# Patient Record
Sex: Female | Born: 1990 | Race: Black or African American | Hispanic: No | Marital: Single | Smoking: Never smoker
Health system: Southern US, Community
[De-identification: ages and names within clinical notes are randomized; demographics above are authoritative.]

## PROBLEM LIST (undated history)

## (undated) ENCOUNTER — Inpatient Hospital Stay (HOSPITAL_COMMUNITY): Payer: Self-pay

## (undated) DIAGNOSIS — Z789 Other specified health status: Secondary | ICD-10-CM

## (undated) HISTORY — PX: WISDOM TOOTH EXTRACTION: SHX21

---

## 2013-01-13 LAB — CYTOLOGY - PAP: PAP SMEAR: NEGATIVE

## 2013-12-01 ENCOUNTER — Ambulatory Visit (INDEPENDENT_AMBULATORY_CARE_PROVIDER_SITE_OTHER): Payer: BC Managed Care – PPO | Admitting: Family Medicine

## 2013-12-01 VITALS — BP 112/72 | HR 90 | Temp 99.3°F | Resp 18 | Ht 61.5 in | Wt 146.0 lb

## 2013-12-01 DIAGNOSIS — J209 Acute bronchitis, unspecified: Secondary | ICD-10-CM

## 2013-12-01 DIAGNOSIS — R059 Cough, unspecified: Secondary | ICD-10-CM

## 2013-12-01 DIAGNOSIS — R05 Cough: Secondary | ICD-10-CM

## 2013-12-01 DIAGNOSIS — J329 Chronic sinusitis, unspecified: Secondary | ICD-10-CM

## 2013-12-01 MED ORDER — HYDROCODONE-HOMATROPINE 5-1.5 MG/5ML PO SYRP: 5.0000 mL | ORAL_SOLUTION | ORAL | Status: DC | PRN

## 2013-12-01 MED ORDER — AMOXICILLIN 875 MG PO TABS: 875.0000 mg | ORAL_TABLET | Freq: Two times a day (BID) | ORAL | Status: DC

## 2013-12-01 MED ORDER — BENZONATATE 100 MG PO CAPS: 100.0000 mg | ORAL_CAPSULE | Freq: Three times a day (TID) | ORAL | Status: DC | PRN

## 2013-12-01 NOTE — Patient Instructions (Signed)
Drink plenty of fluids and try to get enough rest  Take the antibiotic, amoxicillin, one twice daily  Take the cough pills one or 2 pills 3 times daily if needed. If unable to get them just use lozenges  Take the cough syrup 1 teaspoon every 4 hours in the evening or nighttime. Will make you drowsy usually.  Return if worse

## 2013-12-01 NOTE — Progress Notes (Signed)
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. 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 She also works as a Engineer, productionserver and cashier. She did not get a flu shot this year. On about the 30th she got sick with congestion and cough. She did not have much of a fever, but she was taking cough and cold medications with acetaminophen in them. She does not smoke. She missed work all last week. She has persisted with a cough, which actually has gotten worse the last day or 2. She has continued to produce mucus from her nose and chest.  Objective: Healthy-appearing young lady. Her TMs are both partially occluded by cerumen. Her throat was clear. Sinuses nontender. Neck supple without significant nodes. Chest is clear to auscultation. Heart regular without murmurs.  Assessment: Postop cough syndrome, worsening Sinus congestion  Plan: With this going on for 2 weeks and cough now getting worse I am going to go ahead and give her an antibiotic for the cough, and some cough medications. The antibiotic should also help the sinuses if there is a bacterial component there.  Return if not improving

## 2013-12-04 ENCOUNTER — Ambulatory Visit (INDEPENDENT_AMBULATORY_CARE_PROVIDER_SITE_OTHER): Payer: BC Managed Care – PPO | Admitting: Emergency Medicine

## 2013-12-04 VITALS — BP 100/60 | HR 95 | Temp 99.1°F | Resp 16 | Ht 60.5 in | Wt 143.0 lb

## 2013-12-04 DIAGNOSIS — J018 Other acute sinusitis: Secondary | ICD-10-CM

## 2013-12-04 DIAGNOSIS — J209 Acute bronchitis, unspecified: Secondary | ICD-10-CM

## 2013-12-04 MED ORDER — PSEUDOEPHEDRINE-GUAIFENESIN ER 60-600 MG PO TB12: 1.0000 | ORAL_TABLET | Freq: Two times a day (BID) | ORAL | Status: AC

## 2013-12-04 MED ORDER — HYDROCOD POLST-CHLORPHEN POLST 10-8 MG/5ML PO LQCR: 5.0000 mL | Freq: Two times a day (BID) | ORAL | Status: DC | PRN

## 2013-12-04 MED ORDER — AMOXICILLIN-POT CLAVULANATE ER 1000-62.5 MG PO TB12: 2.0000 | ORAL_TABLET | Freq: Two times a day (BID) | ORAL | Status: DC

## 2013-12-04 NOTE — Progress Notes (Signed)
[facility] [address], [place] [id number] [phone]  Date:  12/04/2013   Name:  Marilyn Patel   DOB:  1990/11/23   MRN:  [id number]  PCP:  No primary provider on file.    Chief Complaint: Follow-up   History of Present Illness:  Marilyn Patel is a 23 y.o. very pleasant female patient who presents with the following:  Ill with nasal congestion and clear drainage, purulent post nasal drainage and a sore throat.  Cough is productive of mucopurulent sputum.  No fever or chills.  No improvement on medications from last visit.  Not able to obtain backordered cough syrup.  No improvement with over the counter medications or other home remedies. Denies other complaint or health concern today.   There are no active problems to display for this patient.   History reviewed. No pertinent past medical history.  History reviewed. No pertinent past surgical history.  History  Substance Use Topics  . Smoking status: Never Smoker   . Smokeless tobacco: Not on file  . Alcohol Use: Yes    History reviewed. No pertinent family history.  No Known Allergies  Medication list has been reviewed and updated.  Current Outpatient Prescriptions on File Prior to Visit  Medication Sig Dispense Refill  . amoxicillin (AMOXIL) 875 MG tablet Take 1 tablet (875 mg total) by mouth 2 (two) times daily.  20 tablet  0  . benzonatate (TESSALON) 100 MG capsule Take 1-2 capsules (100-200 mg total) by mouth 3 (three) times daily as needed for cough.  30 capsule  0  . HYDROcodone-homatropine (HYCODAN) 5-1.5 MG/5ML syrup Take 5 mLs by mouth every 4 (four) hours as needed for cough.  120 mL  0   No current facility-administered medications on file prior to visit.    Review of Systems:  As per HPI, otherwise negative.    Physical Examination: Filed Vitals:   12/04/13 1000  BP: 100/60  Pulse: 95  Temp: 99.1 F (37.3 C)  Resp: 16   Filed Vitals:   12/04/13 1000   Height: 5' 0.5" (1.537 m)  Weight: 143 lb (64.864 kg)   Body mass index is 27.46 kg/(m^2). Ideal Body Weight: Weight in (lb) to have BMI = 25: 129.9  GEN: WDWN, moderate distress, Non-toxic, A & O x 3  Persistent cough HEENT: Atraumatic, Normocephalic. Neck supple. No masses, No LAD. Ears and Nose: No external deformity. CV: RRR, No M/G/R. No JVD. No thrill. No extra heart sounds. PULM: CTA B, no wheezes, crackles, rhonchi. No retractions. No resp. distress. No accessory muscle use. ABD: S, NT, ND, +BS. No rebound. No HSM. EXTR: No c/c/e NEURO Normal gait.  PSYCH: Normally interactive. Conversant. Not depressed or anxious appearing.  Calm demeanor.    Assessment and Plan: Bronchitis Sinusitis tussionex mucinex d augmentin xr  Signed,  Phillips OdorJeffery Zahid Carneiro, MD

## 2013-12-04 NOTE — Patient Instructions (Signed)

## 2013-12-06 ENCOUNTER — Inpatient Hospital Stay (HOSPITAL_COMMUNITY)
Admission: AD | Admit: 2013-12-06 | Discharge: 2013-12-06 | Disposition: A | Discharge status: Home / Self Care | Payer: BC Managed Care – PPO | Source: Ambulatory Visit | Attending: Obstetrics & Gynecology | Admitting: Obstetrics & Gynecology

## 2013-12-06 ENCOUNTER — Encounter (HOSPITAL_COMMUNITY): Payer: Self-pay | Admitting: *Deleted

## 2013-12-06 DIAGNOSIS — Z3401 Encounter for supervision of normal first pregnancy, first trimester: Secondary | ICD-10-CM

## 2013-12-06 DIAGNOSIS — Z3201 Encounter for pregnancy test, result positive: Secondary | ICD-10-CM | POA: Insufficient documentation

## 2013-12-06 LAB — POCT PREGNANCY, URINE: Preg Test, Ur: POSITIVE — AB

## 2013-12-06 LAB — URINE MICROSCOPIC-ADD ON

## 2013-12-06 LAB — URINALYSIS, ROUTINE W REFLEX MICROSCOPIC
Bilirubin Urine: NEGATIVE
Glucose, UA: 100 mg/dL — AB
KETONES UR: NEGATIVE mg/dL
NITRITE: NEGATIVE
PROTEIN: NEGATIVE mg/dL
Specific Gravity, Urine: 1.02 (ref 1.005–1.030)
Urobilinogen, UA: 0.2 mg/dL (ref 0.0–1.0)
pH: 6.5 (ref 5.0–8.0)

## 2013-12-06 MED ORDER — PRENATAL PLUS 27-1 MG PO TABS: 1.0000 | ORAL_TABLET | Freq: Every day | ORAL | Status: DC

## 2013-12-06 NOTE — MAU Note (Signed)
Pt presents with having a positive pregnancy test yesterday and is taking some medications that she is concerned about. She says she has taken hydrocodone, nyquil.

## 2013-12-06 NOTE — MAU Provider Note (Signed)
CC: Possible Pregnancy    First Provider Initiated Contact with Patient 12/06/13 1319      Subjective HPI Marilyn Patel 23 y.o. G1P0 3986w1d by sure LMP presents for verification of pregnancy. HPT positive. Denies vaginal bleeding, abdominal pain at present But does report having had spotting once on December 31 and once about a week prior to that. She also says she had lower abdominal cramping intermittently for about 2 weeks which stopped completely a week ago. No bleeding or cramping now. She has slight nausea and is not vomiting. She is also concerned that she was given a cough suppressant with hydrocodone and is on an abx which she thinks is amoxicillin for cough. Slight congestion now, no cough.  No UTI sx. Pregnancy also significant for having what she thought was the flu December 31 when she had a cough and temperature 102. She was seen in the urgent care shortly after, but was not treated for flu. She is ambivalent about the pregnancy and considering termination.   Significant PMH: None.  Significant OB history: None Nonsmoker, works 2 jobs  Objective Blood pressure 134/72, pulse 90, temperature 98.7 F (37.1 C), resp. rate 18, height 5" (0.127 m), weight 64.864 kg (143 lb), last menstrual period 10/10/2013.  Physical Exam General: WN, WD female in no apparent distress Abdomen: soft, nontender, without organomegaly Bimanual cervix thick closed posterior, uterus 6-8 week size  Lab Results Results for orders placed during the hospital encounter of 12/06/13 (from the past 24 hour(s))  URINALYSIS, ROUTINE W REFLEX MICROSCOPIC     Status: Abnormal   Collection Time    12/06/13 12:45 PM      Result Value Range   Color, Urine YELLOW  YELLOW   APPearance HAZY (*) CLEAR   Specific Gravity, Urine 1.020  1.005 - 1.030   pH 6.5  5.0 - 8.0   Glucose, UA 100 (*) NEGATIVE mg/dL   Hgb urine dipstick SMALL (*) NEGATIVE   Bilirubin Urine NEGATIVE  NEGATIVE   Ketones, ur NEGATIVE   NEGATIVE mg/dL   Protein, ur NEGATIVE  NEGATIVE mg/dL   Urobilinogen, UA 0.2  0.0 - 1.0 mg/dL   Nitrite NEGATIVE  NEGATIVE   Leukocytes, UA LARGE (*) NEGATIVE  URINE MICROSCOPIC-ADD ON     Status: Abnormal   Collection Time    12/06/13 12:45 PM      Result Value Range   Squamous Epithelial / LPF MANY (*) RARE   WBC, UA 21-50  <3 WBC/hpf   RBC / HPF 0-2  <3 RBC/hpf   Bacteria, UA FEW (*) RARE   Urine-Other MUCOUS PRESENT    POCT PREGNANCY, URINE     Status: Abnormal   Collection Time    12/06/13 12:52 PM      Result Value Range   Preg Test, Ur POSITIVE (*) NEGATIVE  C&S sent  Assessment 23 y.o. G1P0 1386w1d Pregnancy confirmed URI  Plan AVS on pregnancy precautions Pregnancy verification letter and eligibility information given List of prenatal care providers given   Medication List    STOP taking these medications       HYDROcodone-homatropine 5-1.5 MG/5ML syrup  Commonly known as:  HYCODAN      TAKE these medications       amoxicillin 875 MG tablet  Commonly known as:  AMOXIL  Take 1 tablet (875 mg total) by mouth 2 (two) times daily.     amoxicillin-clavulanate 1000-62.5 MG per tablet  Commonly known as:  AUGMENTIN XR  Take  2 tablets by mouth 2 (two) times daily.     chlorpheniramine-HYDROcodone 10-8 MG/5ML Lqcr  Commonly known as:  TUSSIONEX PENNKINETIC ER  Take 5 mLs by mouth every 12 (twelve) hours as needed.     prenatal vitamin w/FE, FA 27-1 MG Tabs tablet  Take 1 tablet by mouth daily.     pseudoephedrine-guaifenesin 60-600 MG per tablet  Commonly known as:  MUCINEX D  Take 1 tablet by mouth every 12 (twelve) hours.         Follow-up Information   Please follow up. (See provider list)       Danae Orleans, CNM 12/06/2013 1:38 PM

## 2013-12-06 NOTE — Discharge Instructions (Signed)
Pregnancy - First Trimester  During sexual intercourse, millions of sperm go into the vagina. Only 1 sperm will penetrate and fertilize the female egg while it is in the Fallopian tube. One week later, the fertilized egg implants into the wall of the uterus. An embryo begins to develop into a baby. At 6 to 8 weeks, the eyes and face are formed and the heartbeat can be seen on ultrasound. At the end of 12 weeks (first trimester), all the baby's organs are formed. Now that you are pregnant, you will want to do everything you can to have a healthy baby. Two of the most important things are to get good prenatal care and follow your caregiver's instructions. Prenatal care is all the medical care you receive before the baby's birth. It is given to prevent, find, and treat problems during the pregnancy and childbirth.  PRENATAL EXAMS  · During prenatal visits, your weight, blood pressure, and urine are checked. This is done to make sure you are healthy and progressing normally during the pregnancy.  · A pregnant woman should gain 25 to 35 pounds during the pregnancy. However, if you are overweight or underweight, your caregiver will advise you regarding your weight.  · Your caregiver will ask and answer questions for you.  · Blood work, cervical cultures, other necessary tests, and a Pap test are done during your prenatal exams. These tests are done to check on your health and the probable health of your baby. Tests are strongly recommended and done for HIV with your permission. This is the virus that causes AIDS. These tests are done because medicines can be given to help prevent your baby from being born with this infection should you have been infected without knowing it. Blood work is also used to find out your blood type, previous infections, and follow your blood levels (hemoglobin).  · Low hemoglobin (anemia) is common during pregnancy. Iron and vitamins are given to help prevent this. Later in the pregnancy, blood  tests for diabetes will be done along with any other tests if any problems develop.  · You may need other tests to make sure you and the baby are doing well.  CHANGES DURING THE FIRST TRIMESTER   Your body goes through many changes during pregnancy. They vary from person to person. Talk to your caregiver about changes you notice and are concerned about. Changes can include:  · Your menstrual period stops.  · The egg and sperm carry the genes that determine what you look like. Genes from you and your partner are forming a baby. The female genes determine whether the baby is a boy or a girl.  · Your body increases in girth and you may feel bloated.  · Feeling sick to your stomach (nauseous) and throwing up (vomiting). If the vomiting is uncontrollable, call your caregiver.  · Your breasts will begin to enlarge and become tender.  · Your nipples may stick out more and become darker.  · The need to urinate more. Painful urination may mean you have a bladder infection.  · Tiring easily.  · Loss of appetite.  · Cravings for certain kinds of food.  · At first, you may gain or lose a couple of pounds.  · You may have changes in your emotions from day to day (excited to be pregnant or concerned something may go wrong with the pregnancy and baby).  · You may have more vivid and strange dreams.  HOME CARE INSTRUCTIONS   ·   It is very important to avoid all smoking, alcohol and non-prescribed drugs during your pregnancy. These affect the formation and growth of the baby. Avoid chemicals while pregnant to ensure the delivery of a healthy infant.  · Start your prenatal visits by the 12th week of pregnancy. They are usually scheduled monthly at first, then more often in the last 2 months before delivery. Keep your caregiver's appointments. Follow your caregiver's instructions regarding medicine use, blood and lab tests, exercise, and diet.  · During pregnancy, you are providing food for you and your baby. Eat regular, well-balanced  meals. Choose foods such as meat, fish, milk and other low fat dairy products, vegetables, fruits, and whole-grain breads and cereals. Your caregiver will tell you of the ideal weight gain.  · You can help morning sickness by keeping soda crackers at the bedside. Eat a couple before arising in the morning. You may want to use the crackers without salt on them.  · Eating 4 to 5 small meals rather than 3 large meals a day also may help the nausea and vomiting.  · Drinking liquids between meals instead of during meals also seems to help nausea and vomiting.  · A physical sexual relationship may be continued throughout pregnancy if there are no other problems. Problems may be early (premature) leaking of amniotic fluid from the membranes, vaginal bleeding, or belly (abdominal) pain.  · Exercise regularly if there are no restrictions. Check with your caregiver or physical therapist if you are unsure of the safety of some of your exercises. Greater weight gain will occur in the last 2 trimesters of pregnancy. Exercising will help:  · Control your weight.  · Keep you in shape.  · Prepare you for labor and delivery.  · Help you lose your pregnancy weight after you deliver your baby.  · Wear a good support or jogging bra for breast tenderness during pregnancy. This may help if worn during sleep too.  · Ask when prenatal classes are available. Begin classes when they are offered.  · Do not use hot tubs, steam rooms, or saunas.  · Wear your seat belt when driving. This protects you and your baby if you are in an accident.  · Avoid raw meat, uncooked cheese, cat litter boxes, and soil used by cats throughout the pregnancy. These carry germs that can cause birth defects in the baby.  · The first trimester is a good time to visit your dentist for your dental health. Getting your teeth cleaned is okay. Use a softer toothbrush and brush gently during pregnancy.  · Ask for help if you have financial, counseling, or nutritional needs  during pregnancy. Your caregiver will be able to offer counseling for these needs as well as refer you for other special needs.  · Do not take any medicines or herbs unless told by your caregiver.  · Inform your caregiver if there is any mental or physical domestic violence.  · Make a list of emergency phone numbers of family, friends, hospital, and police and fire departments.  · Write down your questions. Take them to your prenatal visit.  · Do not douche.  · Do not cross your legs.  · If you have to stand for long periods of time, rotate you feet or take small steps in a circle.  · You may have more vaginal secretions that may require a sanitary pad. Do not use tampons or scented sanitary pads.  MEDICINES AND DRUG USE IN PREGNANCY  ·   Take prenatal vitamins as directed. The vitamin should contain 1 milligram of folic acid. Keep all vitamins out of reach of children. Only a couple vitamins or tablets containing iron may be fatal to a baby or young child when ingested.  · Avoid use of all medicines, including herbs, over-the-counter medicines, not prescribed or suggested by your caregiver. Only take over-the-counter or prescription medicines for pain, discomfort, or fever as directed by your caregiver. Do not use aspirin, ibuprofen, or naproxen unless directed by your caregiver.  · Let your caregiver also know about herbs you may be using.  · Alcohol is related to a number of birth defects. This includes fetal alcohol syndrome. All alcohol, in any form, should be avoided completely. Smoking will cause low birth rate and premature babies.  · Street or illegal drugs are very harmful to the baby. They are absolutely forbidden. A baby born to an addicted mother will be addicted at birth. The baby will go through the same withdrawal an adult does.  · Let your caregiver know about any medicines that you have to take and for what reason you take them.  SEEK MEDICAL CARE IF:   You have any concerns or worries during your  pregnancy. It is better to call with your questions if you feel they cannot wait, rather than worry about them.  SEEK IMMEDIATE MEDICAL CARE IF:   · An unexplained oral temperature above 102° F (38.9° C) develops, or as your caregiver suggests.  · You have leaking of fluid from the vagina (birth canal). If leaking membranes are suspected, take your temperature and inform your caregiver of this when you call.  · There is vaginal spotting or bleeding. Notify your caregiver of the amount and how many pads are used.  · You develop a bad smelling vaginal discharge with a change in the color.  · You continue to feel sick to your stomach (nauseated) and have no relief from remedies suggested. You vomit blood or coffee ground-like materials.  · You lose more than 2 pounds of weight in 1 week.  · You gain more than 2 pounds of weight in 1 week and you notice swelling of your face, hands, feet, or legs.  · You gain 5 pounds or more in 1 week (even if you do not have swelling of your hands, face, legs, or feet).  · You get exposed to German measles and have never had them.  · You are exposed to fifth disease or chickenpox.  · You develop belly (abdominal) pain. Round ligament discomfort is a common non-cancerous (benign) cause of abdominal pain in pregnancy. Your caregiver still must evaluate this.  · You develop headache, fever, diarrhea, pain with urination, or shortness of breath.  · You fall or are in a car accident or have any kind of trauma.  · There is mental or physical violence in your home.  Document Released: 10/31/2001 Document Revised: 07/31/2012 Document Reviewed: 05/04/2009  Exit[employer]® Patient Information ©2014 [employer].

## 2013-12-07 LAB — URINE CULTURE: Colony Count: 85000

## 2013-12-10 NOTE — MAU Provider Note (Signed)
Attestation of Attending Supervision of Advanced Practitioner (PA/CNM/NP): Evaluation and management procedures were performed by the Advanced Practitioner under my supervision and collaboration.  I have reviewed the Advanced Practitioner's note and chart, and I agree with the management and plan.  Markice Torbert, MD, FACOG Attending Obstetrician & Gynecologist Faculty Practice, [facility]  

## 2014-02-09 ENCOUNTER — Encounter: Payer: BC Managed Care – PPO | Admitting: Family Medicine

## 2014-09-18 ENCOUNTER — Ambulatory Visit (INDEPENDENT_AMBULATORY_CARE_PROVIDER_SITE_OTHER): Payer: BC Managed Care – PPO | Admitting: Physician Assistant

## 2014-09-18 VITALS — BP 108/70 | HR 96 | Temp 98.6°F | Resp 16 | Ht 62.0 in | Wt 130.6 lb

## 2014-09-18 DIAGNOSIS — J029 Acute pharyngitis, unspecified: Secondary | ICD-10-CM

## 2014-09-18 DIAGNOSIS — R059 Cough, unspecified: Secondary | ICD-10-CM

## 2014-09-18 DIAGNOSIS — R05 Cough: Secondary | ICD-10-CM

## 2014-09-18 LAB — POCT RAPID STREP A (OFFICE): RAPID STREP A SCREEN: NEGATIVE

## 2014-09-18 NOTE — Progress Notes (Signed)
   Subjective:    Patient ID: Marilyn Patel, female    DOB: 06/07/1991, 23 y.o.   MRN: [id number] There are no active problems to display for this patient.  Prior to Admission medications   Medication Sig Start Date End Date Taking? Authorizing Provider  pseudoephedrine-guaifenesin (MUCINEX D) 60-600 MG per tablet Take 1 tablet by mouth every 12 (twelve) hours. 12/04/13 12/04/14 Yes Carmelina DaneJeffery S Anderson, MD   Cough Associated symptoms include headaches, rhinorrhea and a sore throat. Pertinent negatives include no chills, ear pain, fever, rash, shortness of breath or wheezing.    This is a 23 year old female with no significant PMH presenting with 2 days of cough, runny nose, headache, and sore throat. She is not having otalgia, facial pain, fever or chills. Her cough is productive of white sputum, mostly in the mornings. The cough is not keeping her up at night. She is experiencing hoarseness which is normal for her when she gets sick. Her boyfriend was recently sick with same symptoms, better now. She has tried nyquil and mucinex - both have helped. She does not smoke and does not have asthma. She reports she generally gets strep once a year.  Review of Systems  Constitutional: Negative for fever and chills.  HENT: Positive for rhinorrhea, sore throat and voice change. Negative for congestion, ear pain and sinus pressure.   Eyes: Negative.   Respiratory: Positive for cough. Negative for shortness of breath and wheezing.   Gastrointestinal: Negative.   Skin: Negative for rash.  Neurological: Positive for headaches.      Objective:   Physical Exam  Constitutional: She is oriented to person, place, and time. She appears well-developed and well-nourished. No distress.  HENT:  Head: Normocephalic and atraumatic.  Right Ear: Hearing, tympanic membrane, external ear and ear canal normal.  Left Ear: Hearing and external ear normal.  Nose: Mucosal edema present.  Mouth/Throat: Uvula is  midline and mucous membranes are normal. Oropharyngeal exudate and posterior oropharyngeal erythema present.  Left TM blocked by cerumen  Eyes: Conjunctivae are normal. Right eye exhibits no discharge. Left eye exhibits no discharge. No scleral icterus.  Lymphadenopathy:       Head (right side): No submental, no submandibular, no tonsillar and no occipital adenopathy present.       Head (left side): No submental, no submandibular, no tonsillar and no occipital adenopathy present.    She has no cervical adenopathy.  Neurological: She is alert and oriented to person, place, and time.  Skin: Skin is warm, dry and intact. No rash noted. She is not diaphoretic.  Psychiatric: She has a normal mood and affect. Her speech is normal and behavior is normal. Thought content normal.   Results for orders placed in visit on 09/18/14  POCT RAPID STREP A (OFFICE)      Result Value Ref Range   Rapid Strep A Screen Negative  Negative      Assessment & Plan:  1. Acute pharyngitis, unspecified pharyngitis type  Rapid strep negative, culture pending. May take ibuprofen and/or tylenol for pain.  - POCT rapid strep A - Culture, Group A Strep  2. Cough  Continue taking mucinex and hydrate. Will return in 7-10 days if symptoms worsen or fail to improve.  Roswell MinersNicole V. Dyke BrackettBush, PA-C, MHS [facility]  09/18/2014

## 2014-09-18 NOTE — Patient Instructions (Signed)
We will let you know the results of your throat culture. This is likely a viral illnesses and will get better with supportive care - continue mucinex and nyquil. Drink 64 oz water a day and get plenty of rest. Return to clinic if not better in 7-10 days.

## 2014-09-18 NOTE — Progress Notes (Signed)
I was directly involved with the patient's care and agree with the physical, diagnosis and treatment plan.  

## 2014-09-20 LAB — CULTURE, GROUP A STREP: Organism ID, Bacteria: NORMAL

## 2015-08-26 ENCOUNTER — Telehealth: Payer: Self-pay

## 2015-08-26 ENCOUNTER — Ambulatory Visit (INDEPENDENT_AMBULATORY_CARE_PROVIDER_SITE_OTHER): Payer: BLUE CROSS/BLUE SHIELD | Admitting: Family Medicine

## 2015-08-26 VITALS — BP 110/68 | HR 84 | Temp 99.3°F | Resp 18 | Ht 61.0 in | Wt 132.4 lb

## 2015-08-26 DIAGNOSIS — N926 Irregular menstruation, unspecified: Secondary | ICD-10-CM

## 2015-08-26 DIAGNOSIS — L03011 Cellulitis of right finger: Secondary | ICD-10-CM | POA: Diagnosis not present

## 2015-08-26 LAB — POCT URINE PREGNANCY: PREG TEST UR: NEGATIVE

## 2015-08-26 MED ORDER — CEPHALEXIN 500 MG PO CAPS: 500.0000 mg | ORAL_CAPSULE | Freq: Two times a day (BID) | ORAL | Status: DC

## 2015-08-26 NOTE — Telephone Encounter (Signed)
Pt states that she is having severe pain and request that a pain medication be prescribed for her. She's pregnant and can't take anything over the counter.

## 2015-08-26 NOTE — Progress Notes (Signed)
Chief Complaint:  Chief Complaint  Patient presents with  . Pregnancy Test    Pt. is trying to conceive, wants to know if shes pregnant.   . Finger Injury    Has a infected area on right ring finger, x3 days painful    HPI: Marilyn Patel is a 24 y.o. 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 She is not diabetic.  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 Has had bloating and breast tenderness , no nausea.   History reviewed. No pertinent past medical history. History reviewed. No pertinent past surgical history. Social History   Social History  . Marital Status: Single    Spouse Name: N/A  . Number of Children: N/A  . Years of Education: N/A   Social History Main Topics  . Smoking status: Never Smoker   . Smokeless tobacco: Never Used  . Alcohol Use: No  . Drug Use: Yes    Special: Marijuana  . Sexual Activity: Yes    Birth Control/ Protection: None   Other Topics Concern  . None   Social History Narrative   History reviewed. No pertinent family history. No Known Allergies Prior to Admission medications   Not on File     ROS: The patient denies fevers, chills, night sweats, unintentional weight loss, chest pain, palpitations, wheezing, dyspnea on exertion, nausea, vomiting, abdominal pain, dysuria, hematuria, melena, numbness, weakness, or tingling.   All other systems have been reviewed and were otherwise negative with the exception of those mentioned in the HPI and as above.    PHYSICAL EXAM: Filed Vitals:   08/26/15 1522  BP: 110/68  Pulse: 84  Temp: 99.3 F (37.4 C)  Resp: 18   Body mass index is 25.03 kg/(m^2).   General: Alert, no acute distress HEENT:  Normocephalic, atraumatic, oropharynx patent. EOMI, PERRLA Cardiovascular:   Regular rate and rhythm, no rubs murmurs or gallops.  No Carotid bruits, radial pulse intact. No pedal edema.  Respiratory: Clear to auscultation bilaterally.  No wheezes, rales, or rhonchi.  No cyanosis, no use of accessory musculature Abdominal: No organomegaly, abdomen is soft and non-tender, positive bowel sounds. No masses. Skin: + right finger paronychia with pus Neurologic: Facial musculature symmetric. Psychiatric: Patient acts appropriately throughout our interaction. Lymphatic: No cervical or submandibular lymphadenopathy Musculoskeletal: Gait intact. No edema, tenderness   LABS: Results for orders placed or performed in visit on 08/26/15  Wound culture  Result Value Ref Range   Gram Stain Moderate    Gram Stain WBC present-predominately PMN    Gram Stain No Squamous Epithelial Cells Seen    Gram Stain Few Gram Positive Cocci In Pairs In Chains   POCT urine pregnancy  Result Value Ref Range   Preg Test, Ur Negative Negative     EKG/XRAY:   Primary read interpreted by Dr. Conley Rolls at [facility].   ASSESSMENT/PLAN: Encounter Diagnoses  Name Primary?  . Irregular periods Yes  . Paronychia, right    Rx keflex otc pain meds ie ibuprfen and or tylenol  Wound care as directed , fu as directed Wound cx pending  Gross sideeffects, risk and benefits, and alternatives of medications d/w patient. Patient is aware that all medications have potential sideeffects and we  are unable to predict every sideeffect or drug-drug interaction that may occur.  Hamilton Capri DO  08/28/2015 12:59 PM  Patient called in for pain meds, she is not pregnant based on last ov and prengancy test, LM and she was given tramadol . Thsi was called in, she is supposed to get rechecked, wound cx can be discussed at that time.

## 2015-08-26 NOTE — Patient Instructions (Signed)
Paronychia °Paronychia is an infection of the skin that surrounds a nail. It usually affects the skin around a fingernail, but it may also occur near a toenail. It often causes pain and swelling around the nail. This condition may come on suddenly or develop over a longer period. In some cases, a collection of pus (abscess) can form near or under the nail. Usually, paronychia is not serious and it clears up with treatment. °CAUSES °This condition may be caused by bacteria or fungi. It is commonly caused by either Streptococcus or Staphylococcus bacteria. The bacteria or fungi often cause the infection by getting into the affected area through an opening in the skin, such as a cut or a hangnail. °RISK FACTORS °This condition is more likely to develop in: °· People who get their hands wet often, such as those who work as dishwashers, bartenders, or nurses. °· People who bite their fingernails or suck their thumbs. °· People who trim their nails too short. °· People who have hangnails or injured fingertips. °· People who get manicures. °· People who have diabetes. °SYMPTOMS °Symptoms of this condition include: °· Redness and swelling of the skin near the nail. °· Tenderness around the nail when you touch the area. °· Pus-filled bumps under the cuticle. The cuticle is the skin at the base or sides of the nail. °· Fluid or pus under the nail. °· Throbbing pain in the area. °DIAGNOSIS °This condition is usually diagnosed with a physical exam. In some cases, a sample of pus may be taken from an abscess to be tested in a lab. This can help to determine what type of bacteria or fungi is causing the condition. °TREATMENT °Treatment for this condition depends on the cause and severity of the condition. If the condition is mild, it may clear up on its own in a few days. Your health care provider may recommend soaking the affected area in warm water a few times a day. When treatment is needed, the options may  include: °· Antibiotic medicine, if the condition is caused by a bacterial infection. °· Antifungal medicine, if the condition is caused by a fungal infection. °· Incision and drainage, if an abscess is present. In this procedure, the health care provider will cut open the abscess so the pus can drain out. °HOME CARE INSTRUCTIONS °· Soak the affected area in warm water if directed to do so by your health care provider. You may be told to do this for 20 minutes, 2-3 times a day. Keep the area dry in between soakings. °· Take medicines only as directed by your health care provider. °· If you were prescribed an antibiotic medicine, finish all of it even if you start to feel better. °· Keep the affected area clean. °· Do not try to drain a fluid-filled bump yourself. °· If you will be washing dishes or performing other tasks that require your hands to get wet, wear rubber gloves. You should also wear gloves if your hands might come in contact with irritating substances, such as cleaners or chemicals. °· Follow your health care provider's instructions about: °¨ Wound care. °¨ Bandage (dressing) changes and removal. °SEEK MEDICAL CARE IF: °· Your symptoms get worse or do not improve with treatment. °· You have a fever or chills. °· You have redness spreading from the affected area. °· You have continued or increased fluid, blood, or pus coming from the affected area. °· Your finger or knuckle becomes swollen or is difficult to move. °  °  This information is not intended to replace advice given to you by your health care provider. Make sure you discuss any questions you have with your health care provider. °  °Document Released: 05/02/2001 Document Revised: 03/23/2015 Document Reviewed: 10/14/2014 °Elsevier Interactive Patient Education ©2016 Elsevier Inc. ° °

## 2015-08-26 NOTE — Progress Notes (Signed)
Procedure Consent obtained. Iodine prep. 3/4 cc 1% lido local anesthesia. 1/2 cm incision made. Purulence expressed. Wound culture obtained. Wound irrigated. 1/4 in plain packing and clean dressing placed. Care instructions discussed.

## 2015-08-27 MED ORDER — TRAMADOL HCL 50 MG PO TABS: 50.0000 mg | ORAL_TABLET | Freq: Three times a day (TID) | ORAL | Status: DC | PRN

## 2015-08-27 NOTE — Telephone Encounter (Signed)
Called tramadol into her pharmacy for pain , LM on mobile phone to advise that she should not take the medicine if she is pregnant or trying to get pregnant, recommend using condom. Her pregnancy test yesterday was negative. Gross SEs of tramadol given over the phone.

## 2015-08-29 LAB — WOUND CULTURE: Gram Stain: NONE SEEN

## 2015-09-09 LAB — OB RESULTS CONSOLE ABO/RH: RH Type: POSITIVE

## 2015-09-29 ENCOUNTER — Encounter: Payer: Self-pay | Admitting: *Deleted

## 2015-10-01 ENCOUNTER — Encounter: Payer: Self-pay | Admitting: *Deleted

## 2015-10-25 ENCOUNTER — Encounter: Payer: Self-pay | Admitting: Obstetrics and Gynecology

## 2015-10-27 ENCOUNTER — Ambulatory Visit (INDEPENDENT_AMBULATORY_CARE_PROVIDER_SITE_OTHER): Payer: Medicaid Other | Admitting: Advanced Practice Midwife

## 2015-10-27 ENCOUNTER — Encounter: Payer: Self-pay | Admitting: Advanced Practice Midwife

## 2015-10-27 ENCOUNTER — Other Ambulatory Visit (HOSPITAL_COMMUNITY)
Admission: RE | Admit: 2015-10-27 | Discharge: 2015-10-27 | Disposition: A | Discharge status: Home / Self Care | Payer: Medicaid Other | Source: Ambulatory Visit | Attending: Obstetrics and Gynecology | Admitting: Obstetrics and Gynecology

## 2015-10-27 VITALS — BP 124/71 | HR 106 | Temp 99.0°F | Ht 60.0 in | Wt 138.5 lb

## 2015-10-27 DIAGNOSIS — Z3482 Encounter for supervision of other normal pregnancy, second trimester: Secondary | ICD-10-CM | POA: Diagnosis present

## 2015-10-27 DIAGNOSIS — Z113 Encounter for screening for infections with a predominantly sexual mode of transmission: Secondary | ICD-10-CM | POA: Insufficient documentation

## 2015-10-27 DIAGNOSIS — K117 Disturbances of salivary secretion: Secondary | ICD-10-CM | POA: Diagnosis not present

## 2015-10-27 DIAGNOSIS — Z3492 Encounter for supervision of normal pregnancy, unspecified, second trimester: Secondary | ICD-10-CM | POA: Insufficient documentation

## 2015-10-27 DIAGNOSIS — Z124 Encounter for screening for malignant neoplasm of cervix: Secondary | ICD-10-CM

## 2015-10-27 LAB — POCT URINALYSIS DIP (DEVICE)
Bilirubin Urine: NEGATIVE
Glucose, UA: NEGATIVE mg/dL
Nitrite: NEGATIVE
Protein, ur: 100 mg/dL — AB
Specific Gravity, Urine: >=1.03 (ref 1.005–1.030)
Urobilinogen, UA: 0.2 mg/dL (ref 0.0–1.0)
pH: 6.5 (ref 5.0–8.0)

## 2015-10-27 MED ORDER — GLYCOPYRROLATE 1 MG PO TABS: 1.0000 mg | ORAL_TABLET | Freq: Three times a day (TID) | ORAL | Status: DC | PRN

## 2015-10-27 NOTE — Progress Notes (Signed)
Pt reports creamy white vaginal discharge w/itching x 1-2 wks.

## 2015-10-27 NOTE — Patient Instructions (Signed)

## 2015-10-27 NOTE — Progress Notes (Signed)
See Smart Set  Subjective:    Marilyn Patel is a G2P0010 [id number] being seen today for her first obstetrical visit.  Her obstetrical history is significant for Ptyalism. Patient does intend to breast feed. Pregnancy history fully reviewed.  Patient reports Excess saliva/ptyalism.  Filed Vitals:   10/27/15 1344 10/27/15 1450  BP: 124/71   Pulse: 106   Temp: 99 F (37.2 C)   Height:  5' (1.524 m)  Weight: 138 lb 8 oz (62.823 kg)     HISTORY: OB History  Gravida Para Term Preterm AB SAB TAB Ectopic Multiple Living  2 0 0 0 1 0 1 0 0 0     # Outcome Date GA Lbr Len/2nd Weight Sex Delivery Anes PTL Lv  2 Current           1 TAB 2015             History reviewed. No pertinent past medical history. Past Surgical History  Procedure Laterality Date  . Wisdom tooth extraction     History reviewed. No pertinent family history.   Exam    Uterus:  Fundal Height: 12 cm  Pelvic Exam:    Perineum: No Hemorrhoids, Normal Perineum   Vulva: Bartholin's, Urethra, Skene's normal   Vagina:  normal mucosa, normal discharge   pH:    Cervix: nulliparous appearance   Adnexa: normal adnexa and no mass, fullness, tenderness   Bony Pelvis: gynecoid  System: Breast:  normal appearance, no masses or tenderness   Skin: normal coloration and turgor, no rashes    Neurologic: oriented, grossly non-focal   Extremities: normal strength, tone, and muscle mass   HEENT neck supple with midline trachea   Mouth/Teeth mucous membranes moist, pharynx normal without lesions   Neck supple and no masses   Cardiovascular: regular rate and rhythm, no murmurs or gallops   Respiratory:  appears well, vitals normal, no respiratory distress, acyanotic, normal RR, ear and throat exam is normal, neck free of mass or lymphadenopathy, chest clear, no wheezing, crepitations, rhonchi, normal symmetric air entry   Abdomen: soft, non-tender; bowel sounds normal; no masses,  no organomegaly   Urinary: urethral  meatus normal      Assessment:    Pregnancy: G2P0010 Patient Active Problem List   Diagnosis Date Noted  . Supervision of normal pregnancy in second trimester 10/27/2015  . Ptyalism 10/27/2015        Plan:     Initial labs drawn. Prenatal vitamins. Problem list reviewed and updated. Genetic Screening discussed First Screen: ordered.  Ultrasound discussed; fetal survey: requested.  Follow up in 4 weeks. 50% of 30 min visit spent on counseling and coordination of care.  Routines reviewed Practice issues reviewed. Hospital facilities available reviewed   [facility] CampusWILLIAMS,Marilyn Konczal 10/27/2015

## 2015-10-28 LAB — PRENATAL PROFILE (SOLSTAS)
Antibody Screen: NEGATIVE
BASOS PCT: 0 % (ref 0–1)
Basophils Absolute: 0 10*3/uL (ref 0.0–0.1)
Eosinophils Absolute: 0.1 10*3/uL (ref 0.0–0.7)
Eosinophils Relative: 1 % (ref 0–5)
HCT: 36.2 % (ref 36.0–46.0)
HEMOGLOBIN: 12.1 g/dL (ref 12.0–15.0)
HIV 1&2 Ab, 4th Generation: NONREACTIVE
Hepatitis B Surface Ag: NEGATIVE
Lymphocytes Relative: 23 % (ref 12–46)
Lymphs Abs: 2.3 10*3/uL (ref 0.7–4.0)
MCH: 29.8 pg (ref 26.0–34.0)
MCHC: 33.4 g/dL (ref 30.0–36.0)
MCV: 89.2 fL (ref 78.0–100.0)
MONO ABS: 0.7 10*3/uL (ref 0.1–1.0)
MONOS PCT: 7 % (ref 3–12)
MPV: 8.8 fL (ref 8.6–12.4)
Neutro Abs: 7 10*3/uL (ref 1.7–7.7)
Neutrophils Relative %: 69 % (ref 43–77)
Platelets: 284 10*3/uL (ref 150–400)
RBC: 4.06 MIL/uL (ref 3.87–5.11)
RDW: 14 % (ref 11.5–15.5)
Rh Type: POSITIVE
Rubella: 5.55 Index — ABNORMAL HIGH (ref <0.90)
WBC: 10.2 10*3/uL (ref 4.0–10.5)

## 2015-10-28 LAB — PRESCRIPTION MONITORING PROFILE (19 PANEL)
Amphetamine/Meth: NEGATIVE ng/mL
BARBITURATE SCREEN, URINE: NEGATIVE ng/mL
BENZODIAZEPINE SCREEN, URINE: NEGATIVE ng/mL
BUPRENORPHINE, URINE: NEGATIVE ng/mL
CREATININE, URINE: 428.11 mg/dL (ref >20.0)
Cannabinoid Scrn, Ur: NEGATIVE ng/mL
Carisoprodol, Urine: NEGATIVE ng/mL
Cocaine Metabolites: NEGATIVE ng/mL
ECSTASY: NEGATIVE ng/mL
FENTANYL URINE: NEGATIVE ng/mL
METHAQUALONE SCREEN (URINE): NEGATIVE ng/mL
Meperidine, Ur: NEGATIVE ng/mL
Methadone Screen, Urine: NEGATIVE ng/mL
Nitrites, Initial: NEGATIVE ug/mL
OPIATE SCREEN, URINE: NEGATIVE ng/mL
Oxycodone Screen, Ur: NEGATIVE ng/mL
Phencyclidine, Ur: NEGATIVE ng/mL
Propoxyphene: NEGATIVE ng/mL
Tapentadol, urine: NEGATIVE ng/mL
Tramadol Scrn, Ur: NEGATIVE ng/mL
ZOLPIDEM, URINE: NEGATIVE ng/mL
pH, Initial: 6.4 pH (ref 4.5–8.9)

## 2015-10-28 LAB — GC/CHLAMYDIA PROBE AMP (~~LOC~~) NOT AT ARMC
CHLAMYDIA, DNA PROBE: NEGATIVE
Neisseria Gonorrhea: NEGATIVE

## 2015-10-29 LAB — CULTURE, OB URINE

## 2015-10-29 LAB — HEMOGLOBINOPATHY EVALUATION
HGB F QUANT: 0.3 % (ref 0.0–2.0)
Hemoglobin Other: 0 %
Hgb A2 Quant: 2.7 % (ref 2.2–3.2)
Hgb A: 97 % (ref 96.8–97.8)
Hgb S Quant: 0 %

## 2015-11-01 ENCOUNTER — Telehealth: Payer: Self-pay | Admitting: *Deleted

## 2015-11-01 DIAGNOSIS — B3731 Acute candidiasis of vulva and vagina: Secondary | ICD-10-CM

## 2015-11-01 DIAGNOSIS — B373 Candidiasis of vulva and vagina: Secondary | ICD-10-CM

## 2015-11-01 MED ORDER — TERCONAZOLE 0.4 % VA CREA: 1.0000 | TOPICAL_CREAM | Freq: Every day | VAGINAL | Status: DC

## 2015-11-01 NOTE — Telephone Encounter (Signed)
Pt is requesting an rx for yeast infection.

## 2015-11-01 NOTE — Telephone Encounter (Signed)
Sent in terconazole per protocol. Called patient and heard message that vm is not set up.

## 2015-11-24 ENCOUNTER — Encounter: Payer: Medicaid Other | Admitting: Certified Nurse Midwife

## 2015-12-01 ENCOUNTER — Ambulatory Visit (INDEPENDENT_AMBULATORY_CARE_PROVIDER_SITE_OTHER): Payer: Medicaid Other | Admitting: Advanced Practice Midwife

## 2015-12-01 VITALS — BP 117/60 | HR 88 | Temp 98.3°F | Wt 139.5 lb

## 2015-12-01 DIAGNOSIS — Z3482 Encounter for supervision of other normal pregnancy, second trimester: Secondary | ICD-10-CM

## 2015-12-01 DIAGNOSIS — R319 Hematuria, unspecified: Secondary | ICD-10-CM

## 2015-12-01 DIAGNOSIS — Z3492 Encounter for supervision of normal pregnancy, unspecified, second trimester: Secondary | ICD-10-CM

## 2015-12-01 LAB — POCT URINALYSIS DIP (DEVICE)
BILIRUBIN URINE: NEGATIVE
Glucose, UA: NEGATIVE mg/dL
Ketones, ur: NEGATIVE mg/dL
NITRITE: NEGATIVE
Protein, ur: 30 mg/dL — AB
Specific Gravity, Urine: >=1.03 (ref 1.005–1.030)
Urobilinogen, UA: 0.2 mg/dL (ref 0.0–1.0)
pH: 6.5 (ref 5.0–8.0)

## 2015-12-01 NOTE — Progress Notes (Signed)
Breastfeeding tip of the week reviewed Uine showed moderate blood, trace leukocytes

## 2015-12-01 NOTE — Patient Instructions (Signed)
Second Trimester of Pregnancy The second trimester is from week 13 through week 28, months 4 through 6. The second trimester is often a time when you feel your best. Your body has also adjusted to being pregnant, and you begin to feel better physically. Usually, morning sickness has lessened or quit completely, you may have more energy, and you may have an increase in appetite. The second trimester is also a time when the fetus is growing rapidly. At the end of the sixth month, the fetus is about 9 inches long and weighs about 1 pounds. You will likely begin to feel the baby move (quickening) between 18 and 20 weeks of the pregnancy. BODY CHANGES Your body goes through many changes during pregnancy. The changes vary from woman to woman.   Your weight will continue to increase. You will notice your lower abdomen bulging out.  You may begin to get stretch marks on your hips, abdomen, and breasts.  You may develop headaches that can be relieved by medicines approved by your health care provider.  You may urinate more often because the fetus is pressing on your bladder.  You may develop or continue to have heartburn as a result of your pregnancy.  You may develop constipation because certain hormones are causing the muscles that push waste through your intestines to slow down.  You may develop hemorrhoids or swollen, bulging veins (varicose veins).  You may have back pain because of the weight gain and pregnancy hormones relaxing your joints between the bones in your pelvis and as a result of a shift in weight and the muscles that support your balance.  Your breasts will continue to grow and be tender.  Your gums may bleed and may be sensitive to brushing and flossing.  Dark spots or blotches (chloasma, mask of pregnancy) may develop on your face. This will likely fade after the baby is born.  A dark line from your belly button to the pubic area (linea nigra) may appear. This will likely  fade after the baby is born.  You may have changes in your hair. These can include thickening of your hair, rapid growth, and changes in texture. Some women also have hair loss during or after pregnancy, or hair that feels dry or thin. Your hair will most likely return to normal after your baby is born. WHAT TO EXPECT AT YOUR PRENATAL VISITS During a routine prenatal visit:  You will be weighed to make sure you and the fetus are growing normally.  Your blood pressure will be taken.  Your abdomen will be measured to track your baby's growth.  The fetal heartbeat will be listened to.  Any test results from the previous visit will be discussed. Your health care provider may ask you:  How you are feeling.  If you are feeling the baby move.  If you have had any abnormal symptoms, such as leaking fluid, bleeding, severe headaches, or abdominal cramping.  If you are using any tobacco products, including cigarettes, chewing tobacco, and electronic cigarettes.  If you have any questions. Other tests that may be performed during your second trimester include:  Blood tests that check for:  Low iron levels (anemia).  Gestational diabetes (between 24 and 28 weeks).  Rh antibodies.  Urine tests to check for infections, diabetes, or protein in the urine.  An ultrasound to confirm the proper growth and development of the baby.  An amniocentesis to check for possible genetic problems.  Fetal screens for spina bifida   and Down syndrome.  HIV (human immunodeficiency virus) testing. Routine prenatal testing includes screening for HIV, unless you choose not to have this test. HOME CARE INSTRUCTIONS   Avoid all smoking, herbs, alcohol, and unprescribed drugs. These chemicals affect the formation and growth of the baby.  Do not use any tobacco products, including cigarettes, chewing tobacco, and electronic cigarettes. If you need help quitting, ask your health care provider. You may receive  counseling support and other resources to help you quit.  Follow your health care provider's instructions regarding medicine use. There are medicines that are either safe or unsafe to take during pregnancy.  Exercise only as directed by your health care provider. Experiencing uterine cramps is a good sign to stop exercising.  Continue to eat regular, healthy meals.  Wear a good support bra for breast tenderness.  Do not use hot tubs, steam rooms, or saunas.  Wear your seat belt at all times when driving.  Avoid raw meat, uncooked cheese, cat litter boxes, and soil used by cats. These carry germs that can cause birth defects in the baby.  Take your prenatal vitamins.  Take 1500-2000 mg of calcium daily starting at the 20th week of pregnancy until you deliver your baby.  Try taking a stool softener (if your health care provider approves) if you develop constipation. Eat more high-fiber foods, such as fresh vegetables or fruit and whole grains. Drink plenty of fluids to keep your urine clear or pale yellow.  Take warm sitz baths to soothe any pain or discomfort caused by hemorrhoids. Use hemorrhoid cream if your health care provider approves.  If you develop varicose veins, wear support hose. Elevate your feet for 15 minutes, 3-4 times a day. Limit salt in your diet.  Avoid heavy lifting, wear low heel shoes, and practice good posture.  Rest with your legs elevated if you have leg cramps or low back pain.  Visit your dentist if you have not gone yet during your pregnancy. Use a soft toothbrush to brush your teeth and be gentle when you floss.  A sexual relationship may be continued unless your health care provider directs you otherwise.  Continue to go to all your prenatal visits as directed by your health care provider. SEEK MEDICAL CARE IF:   You have dizziness.  You have mild pelvic cramps, pelvic pressure, or nagging pain in the abdominal area.  You have persistent nausea,  vomiting, or diarrhea.  You have a bad smelling vaginal discharge.  You have pain with urination. SEEK IMMEDIATE MEDICAL CARE IF:   You have a fever.  You are leaking fluid from your vagina.  You have spotting or bleeding from your vagina.  You have severe abdominal cramping or pain.  You have rapid weight gain or loss.  You have shortness of breath with chest pain.  You notice sudden or extreme swelling of your face, hands, ankles, feet, or legs.  You have not felt your baby move in over an hour.  You have severe headaches that do not go away with medicine.  You have vision changes.   This information is not intended to replace advice given to you by your health care provider. Make sure you discuss any questions you have with your health care provider.   Document Released: 10/31/2001 Document Revised: 11/27/2014 Document Reviewed: 01/07/2013 Elsevier Interactive Patient Education 2016 Elsevier Inc.   Pregnancy and Influenza Influenza, also called the flu, is an infection of the respiratory tract. If you are pregnant, you   are more likely to catch the flu. You are also more likely to have a more serious case of the flu. This is because pregnancy lowers your body's ability to fight off infections (it weakens your immune system). It also puts additional stress on your heart and lungs, which makes you more likely to have complications. Having a bad case of the flu, especially with a high fever, can be dangerous for your developing baby. It can cause you to go into early labor. HOW DO PEOPLE GET THE FLU? The flu is caused by the influenza virus. This virus is common every year in the fall and winter. It spreads when virus particles get passed from person to person. You can get the virus if you are near a sick person who is coughing or sneezing. You can also get the virus if you touch something that has the virus on it and then touch your face. HOW CAN I PROTECT MYSELF AGAINST THE  FLU?  Get a flu shot. The best way to prevent the flu is to get a flu shot before flu season starts. The flu shot is not dangerous for your developing baby. It may even help protect your baby from the flu for up to 6 months after birth. The flu shot is one type of flu vaccine. Another type is a nasal spray vaccine. Do not get the nasal spray vaccine. It is not approved for pregnancy.  Do not come in close contact with sick people.  Do not share food, drinks, or utensils with other people.  Wash your hands often. Use hand sanitizer when soap and water are not available. WHAT SHOULD I DO IF I HAVE FLU SYMPTOMS? If you have any flu symptoms, call your health care provider right away. Flu symptoms include:  Fever or chills.  Muscle aches.  Headache.  Sore throat.  Nasal congestion.  Cough.  Feeling tired.  Loss of appetite.  Vomiting.  Diarrhea. You may be able to take an antiviral medicine to keep the flu from becoming severe and to shorten how long it lasts. WHAT SHOULD I DO AT HOME IF I AM DIAGNOSED WITH THE FLU?  Do not take any medicine, including cold or flu medicine, unless directed by your health care provider.  If you take antiviral medicine, make sure you finish it even if you start to feel better.  Drink enough fluid to keep your urine clear or pale yellow.  Get plenty of rest. WHEN WOULD I SEEK IMMEDIATE MEDICAL CARE IF I HAVE THE FLU?  You have trouble breathing.  You have chest pain.  You begin to have labor pains.  You have a high fever that does not go down after you take medicine.  You do not feel your baby move.  You have diarrhea or vomiting that will not go away.   This information is not intended to replace advice given to you by your health care provider. Make sure you discuss any questions you have with your health care provider.   Document Released: 09/08/2008 Document Revised: 11/11/2013 Document Reviewed: 10/03/2013 Elsevier Interactive  Patient Education 2016 Elsevier Inc.   

## 2015-12-02 LAB — AFP, QUAD SCREEN
AFP: 93.9 ng/mL
CURR GEST AGE: 17.5 wks.days
HCG TOTAL: 13.97 [IU]/mL
INH: 125.6 pg/mL
INTERPRETATION-AFP: NEGATIVE
MOM FOR INH: 0.72
MoM for AFP: 1.89
MoM for hCG: 0.42
Open Spina bifida: NEGATIVE
Tri 18 Scr Risk Est: NEGATIVE
UE3 VALUE: 1.87 ng/mL
uE3 Mom: 1.51

## 2015-12-02 LAB — CULTURE, OB URINE: Colony Count: 80000

## 2015-12-04 ENCOUNTER — Other Ambulatory Visit: Payer: Self-pay | Admitting: Advanced Practice Midwife

## 2015-12-04 ENCOUNTER — Encounter: Payer: Self-pay | Admitting: Advanced Practice Midwife

## 2015-12-04 DIAGNOSIS — R8271 Bacteriuria: Secondary | ICD-10-CM | POA: Insufficient documentation

## 2015-12-04 MED ORDER — AMOXICILLIN 500 MG PO CAPS: 500.0000 mg | ORAL_CAPSULE | Freq: Three times a day (TID) | ORAL | Status: DC

## 2015-12-04 NOTE — Progress Notes (Signed)
Subjective:  Marilyn Patel is a 25 y.o. G2P0010 at 17 weeks 5 days being seen today for ongoing prenatal care.  She is currently monitored for the following issues for this low-risk pregnancy and has Supervision of normal pregnancy in second trimester; Ptyalism on her problem list.  Patient reports no complaints.  Contractions: Not present. Vag. Bleeding: None.  Movement: Present. Denies leaking of fluid.   The following portions of the patient's history were reviewed and updated as appropriate: allergies, current medications, past family history, past medical history, past social history, past surgical history and problem list. Problem list updated.  Objective:   Filed Vitals:   12/01/15 1144  BP: 117/60  Pulse: 88  Temp: 98.3 F (36.8 C)  Weight: 139 lb 8 oz (63.277 kg)    Fetal Status: Fetal Heart Rate (bpm): 160 Fundal Height: 18 cm Movement: Present     General:  Alert, oriented and cooperative. Patient is in no acute distress.  Skin: Skin is warm and dry. No rash noted.   Cardiovascular: Normal heart rate noted  Respiratory: Normal respiratory effort, no problems with respiration noted  Abdomen: Soft, gravid, appropriate for gestational age. Pain/Pressure: Absent     Pelvic: Vag. Bleeding: None     Cervical exam deferred        Extremities: Normal range of motion.  Edema: None  Mental Status: Normal mood and affect. Normal behavior. Normal judgment and thought content.   Urinalysis: Urine Protein: 1+ Urine Glucose: Negative  Assessment and Plan:  Pregnancy: G2P0010 at 17 weeks 5 days  1. Hematuria  - Culture, OB Urine  2. Supervision of normal pregnancy in second trimester  - AFP, Quad Screen - Anatomy scan scheduled.  Preterm labor symptoms and general obstetric precautions including but not limited to vaginal bleeding, contractions, leaking of fluid and fetal movement were reviewed in detail with the patient. Please refer to After Visit Summary for other  counseling recommendations.  Return in about 4 weeks (around 12/29/2015).   Dorathy KinsmanVirginia Myles Mallicoat, CNM

## 2015-12-06 NOTE — Progress Notes (Signed)
Notified of patient results.

## 2015-12-10 ENCOUNTER — Ambulatory Visit (HOSPITAL_COMMUNITY)
Admission: RE | Admit: 2015-12-10 | Discharge: 2015-12-10 | Disposition: A | Discharge status: Home / Self Care | Payer: Medicaid Other | Source: Ambulatory Visit | Attending: Advanced Practice Midwife | Admitting: Advanced Practice Midwife

## 2015-12-10 ENCOUNTER — Other Ambulatory Visit: Payer: Self-pay | Admitting: General Practice

## 2015-12-10 DIAGNOSIS — Z1389 Encounter for screening for other disorder: Secondary | ICD-10-CM

## 2015-12-10 DIAGNOSIS — Z3492 Encounter for supervision of normal pregnancy, unspecified, second trimester: Secondary | ICD-10-CM

## 2015-12-10 DIAGNOSIS — Z3A18 18 weeks gestation of pregnancy: Secondary | ICD-10-CM | POA: Insufficient documentation

## 2015-12-10 DIAGNOSIS — Z36 Encounter for antenatal screening of mother: Secondary | ICD-10-CM | POA: Diagnosis not present

## 2015-12-29 ENCOUNTER — Ambulatory Visit (INDEPENDENT_AMBULATORY_CARE_PROVIDER_SITE_OTHER): Payer: Medicaid Other | Admitting: Certified Nurse Midwife

## 2015-12-29 VITALS — BP 98/43 | HR 87 | Temp 98.5°F | Wt 152.3 lb

## 2015-12-29 DIAGNOSIS — Z3482 Encounter for supervision of other normal pregnancy, second trimester: Secondary | ICD-10-CM

## 2015-12-29 DIAGNOSIS — Z3492 Encounter for supervision of normal pregnancy, unspecified, second trimester: Secondary | ICD-10-CM

## 2015-12-29 LAB — POCT URINALYSIS DIP (DEVICE)
Bilirubin Urine: NEGATIVE
Glucose, UA: NEGATIVE mg/dL
Ketones, ur: NEGATIVE mg/dL
Nitrite: NEGATIVE
Protein, ur: 30 mg/dL — AB
Specific Gravity, Urine: >=1.03 (ref 1.005–1.030)
Urobilinogen, UA: 0.2 mg/dL (ref 0.0–1.0)
pH: 6.5 (ref 5.0–8.0)

## 2015-12-29 NOTE — Patient Instructions (Signed)
Monilial Vaginitis Vaginitis in a soreness, swelling and redness (inflammation) of the vagina and vulva. Monilial vaginitis is not a sexually transmitted infection. CAUSES  Yeast vaginitis is caused by yeast (candida) that is normally found in your vagina. With a yeast infection, the candida has overgrown in number to a point that upsets the chemical balance. SYMPTOMS   White, thick vaginal discharge.  Swelling, itching, redness and irritation of the vagina and possibly the lips of the vagina (vulva).  Burning or painful urination.  Painful intercourse. DIAGNOSIS  Things that may contribute to monilial vaginitis are:  Postmenopausal and virginal states.  Pregnancy.  Infections.  Being tired, sick or stressed, especially if you had monilial vaginitis in the past.  Diabetes. Good control will help lower the chance.  Birth control pills.  Tight fitting garments.  Using bubble bath, feminine sprays, douches or deodorant tampons.  Taking certain medications that kill germs (antibiotics).  Sporadic recurrence can occur if you become ill. TREATMENT  Your caregiver will give you medication.  There are several kinds of anti monilial vaginal creams and suppositories specific for monilial vaginitis. For recurrent yeast infections, use a suppository or cream in the vagina 2 times a week, or as directed.  Anti-monilial or steroid cream for the itching or irritation of the vulva may also be used. Get your caregiver's permission.  Painting the vagina with methylene blue solution may help if the monilial cream does not work.  Eating yogurt may help prevent monilial vaginitis. HOME CARE INSTRUCTIONS   Finish all medication as prescribed.  Do not have sex until treatment is completed or after your caregiver tells you it is okay.  Take warm sitz baths.  Do not douche.  Do not use tampons, especially scented ones.  Wear cotton underwear.  Avoid tight pants and panty  hose.  Tell your sexual partner that you have a yeast infection. They should go to their caregiver if they have symptoms such as mild rash or itching.  Your sexual partner should be treated as well if your infection is difficult to eliminate.  Practice safer sex. Use condoms.  Some vaginal medications cause latex condoms to fail. Vaginal medications that harm condoms are:  Cleocin cream.  Butoconazole (Femstat).  Terconazole (Terazol) vaginal suppository.  Miconazole (Monistat) (may be purchased over the counter). SEEK MEDICAL CARE IF:   You have a temperature by mouth above 102 F (38.9 C).  The infection is getting worse after 2 days of treatment.  The infection is not getting better after 3 days of treatment.  You develop blisters in or around your vagina.  You develop vaginal bleeding, and it is not your menstrual period.  You have pain when you urinate.  You develop intestinal problems.  You have pain with sexual intercourse.   This information is not intended to replace advice given to you by your health care provider. Make sure you discuss any questions you have with your health care provider.   Document Released: 08/16/2005 Document Revised: 01/29/2012 Document Reviewed: 05/10/2015 Elsevier Interactive Patient Education 2016 [employer] Second Trimester of Pregnancy The second trimester is from week 13 through week 28, months 4 through 6. The second trimester is often a time when you feel your best. Your body has also adjusted to being pregnant, and you begin to feel better physically. Usually, morning sickness has lessened or quit completely, you may have more energy, and you may have an increase in appetite. The second trimester is also a time when  the fetus is growing rapidly. At the end of the sixth month, the fetus is about 9 inches long and weighs about 1 pounds. You will likely begin to feel the baby move (quickening) between 18 and 20 weeks of the  pregnancy. BODY CHANGES Your body goes through many changes during pregnancy. The changes vary from woman to woman.   Your weight will continue to increase. You will notice your lower abdomen bulging out.  You may begin to get stretch marks on your hips, abdomen, and breasts.  You may develop headaches that can be relieved by medicines approved by your health care provider.  You may urinate more often because the fetus is pressing on your bladder.  You may develop or continue to have heartburn as a result of your pregnancy.  You may develop constipation because certain hormones are causing the muscles that push waste through your intestines to slow down.  You may develop hemorrhoids or swollen, bulging veins (varicose veins).  You may have back pain because of the weight gain and pregnancy hormones relaxing your joints between the bones in your pelvis and as a result of a shift in weight and the muscles that support your balance.  Your breasts will continue to grow and be tender.  Your gums may bleed and may be sensitive to brushing and flossing.  Dark spots or blotches (chloasma, mask of pregnancy) may develop on your face. This will likely fade after the baby is born.  A dark line from your belly button to the pubic area (linea nigra) may appear. This will likely fade after the baby is born.  You may have changes in your hair. These can include thickening of your hair, rapid growth, and changes in texture. Some women also have hair loss during or after pregnancy, or hair that feels dry or thin. Your hair will most likely return to normal after your baby is born. WHAT TO EXPECT AT YOUR PRENATAL VISITS During a routine prenatal visit:  You will be weighed to make sure you and the fetus are growing normally.  Your blood pressure will be taken.  Your abdomen will be measured to track your baby's growth.  The fetal heartbeat will be listened to.  Any test results from the  previous visit will be discussed. Your health care provider may ask you:  How you are feeling.  If you are feeling the baby move.  If you have had any abnormal symptoms, such as leaking fluid, bleeding, severe headaches, or abdominal cramping.  If you are using any tobacco products, including cigarettes, chewing tobacco, and electronic cigarettes.  If you have any questions. Other tests that may be performed during your second trimester include:  Blood tests that check for:  Low iron levels (anemia).  Gestational diabetes (between 24 and 28 weeks).  Rh antibodies.  Urine tests to check for infections, diabetes, or protein in the urine.  An ultrasound to confirm the proper growth and development of the baby.  An amniocentesis to check for possible genetic problems.  Fetal screens for spina bifida and Down syndrome.  HIV (human immunodeficiency virus) testing. Routine prenatal testing includes screening for HIV, unless you choose not to have this test. HOME CARE INSTRUCTIONS   Avoid all smoking, herbs, alcohol, and unprescribed drugs. These chemicals affect the formation and growth of the baby.  Do not use any tobacco products, including cigarettes, chewing tobacco, and electronic cigarettes. If you need help quitting, ask your health care provider. You may  receive counseling support and other resources to help you quit.  Follow your health care provider's instructions regarding medicine use. There are medicines that are either safe or unsafe to take during pregnancy.  Exercise only as directed by your health care provider. Experiencing uterine cramps is a good sign to stop exercising.  Continue to eat regular, healthy meals.  Wear a good support bra for breast tenderness.  Do not use hot tubs, steam rooms, or saunas.  Wear your seat belt at all times when driving.  Avoid raw meat, uncooked cheese, cat litter boxes, and soil used by cats. These carry germs that can  cause birth defects in the baby.  Take your prenatal vitamins.  Take 1500-2000 mg of calcium daily starting at the 20th week of pregnancy until you deliver your baby.  Try taking a stool softener (if your health care provider approves) if you develop constipation. Eat more high-fiber foods, such as fresh vegetables or fruit and whole grains. Drink plenty of fluids to keep your urine clear or pale yellow.  Take warm sitz baths to soothe any pain or discomfort caused by hemorrhoids. Use hemorrhoid cream if your health care provider approves.  If you develop varicose veins, wear support hose. Elevate your feet for 15 minutes, 3-4 times a day. Limit salt in your diet.  Avoid heavy lifting, wear low heel shoes, and practice good posture.  Rest with your legs elevated if you have leg cramps or low back pain.  Visit your dentist if you have not gone yet during your pregnancy. Use a soft toothbrush to brush your teeth and be gentle when you floss.  A sexual relationship may be continued unless your health care provider directs you otherwise.  Continue to go to all your prenatal visits as directed by your health care provider. SEEK MEDICAL CARE IF:   You have dizziness.  You have mild pelvic cramps, pelvic pressure, or nagging pain in the abdominal area.  You have persistent nausea, vomiting, or diarrhea.  You have a bad smelling vaginal discharge.  You have pain with urination. SEEK IMMEDIATE MEDICAL CARE IF:   You have a fever.  You are leaking fluid from your vagina.  You have spotting or bleeding from your vagina.  You have severe abdominal cramping or pain.  You have rapid weight gain or loss.  You have shortness of breath with chest pain.  You notice sudden or extreme swelling of your face, hands, ankles, feet, or legs.  You have not felt your baby move in over an hour.  You have severe headaches that do not go away with medicine.  You have vision changes.   This  information is not intended to replace advice given to you by your health care provider. Make sure you discuss any questions you have with your health care provider.   Document Released: 10/31/2001 Document Revised: 11/27/2014 Document Reviewed: 01/07/2013 Elsevier Interactive Patient Education [employer]

## 2015-12-29 NOTE — Progress Notes (Signed)
Urine moderate blood C/o itchy white discharge Breastfeeding tip of the week reviewed

## 2015-12-29 NOTE — Progress Notes (Signed)
Subjective:  Marilyn Patel is a 25 y.o. G2P0010 at [redacted]w[redacted]d being seen today for ongoing prenatal care.  She is currently monitored for the following issues for this low-risk pregnancy and has Supervision of normal pregnancy in second trimester; Ptyalism; and GBS bacteriuria on her problem list.  Patient reports vaginal discharge and itching.  Contractions: Not present. Vag. Bleeding: None.  Movement: Present. Denies leaking of fluid.   The following portions of the patient's history were reviewed and updated as appropriate: allergies, current medications, past family history, past medical history, past social history, past surgical history and problem list. Problem list updated.  Objective:   Filed Vitals:   12/29/15 0959  BP: 98/43  Pulse: 87  Temp: 98.5 F (36.9 C)  Weight: 152 lb 4.8 oz (69.083 kg)    Fetal Status: Fetal Heart Rate (bpm): 155   Movement: Present     General:  Alert, oriented and cooperative. Patient is in no acute distress.  Skin: Skin is warm and dry. No rash noted.   Cardiovascular: Normal heart rate noted  Respiratory: Normal respiratory effort, no problems with respiration noted  Abdomen: Soft, gravid, appropriate for gestational age. Pain/Pressure: Present     Pelvic: Vag. Bleeding: None Vag D/C Character: White   Cervical exam deferred        Extremities: Normal range of motion.  Edema: None  Mental Status: Normal mood and affect. Normal behavior. Normal judgment and thought content.   Urinalysis: Urine Protein: 1+ Urine Glucose: Negative  Assessment and Plan:  Pregnancy: G2P0010 at [redacted]w[redacted]d  1. Supervision of normal pregnancy in second trimester  - Wet prep, genital - Urine Culture  Preterm labor symptoms and general obstetric precautions including but not limited to vaginal bleeding, contractions, leaking of fluid and fetal movement were reviewed in detail with the patient. Please refer to After Visit Summary for other counseling recommendations.   No Follow-up on file.   Rhea Pink, CNM

## 2015-12-30 LAB — WET PREP, GENITAL
Clue Cells Wet Prep HPF POC: NONE SEEN
Trich, Wet Prep: NONE SEEN
WBC, Wet Prep HPF POC: NONE SEEN

## 2015-12-31 ENCOUNTER — Telehealth: Payer: Self-pay | Admitting: *Deleted

## 2015-12-31 DIAGNOSIS — B373 Candidiasis of vulva and vagina: Secondary | ICD-10-CM

## 2015-12-31 DIAGNOSIS — B3731 Acute candidiasis of vulva and vagina: Secondary | ICD-10-CM

## 2015-12-31 LAB — URINE CULTURE: Colony Count: >=100000

## 2015-12-31 MED ORDER — TERCONAZOLE 0.4 % VA CREA: 1.0000 | TOPICAL_CREAM | Freq: Every day | VAGINAL | Status: DC

## 2015-12-31 NOTE — Telephone Encounter (Signed)
Pt left message requesting results of recent test and that she needs Rx. I called pt and informed her of wet prep results showing +yeast.  Pt desired Rx which was sent the her pharmacy as requested. Pt voiced understanding.

## 2016-01-08 ENCOUNTER — Inpatient Hospital Stay (HOSPITAL_COMMUNITY): Payer: Medicaid Other

## 2016-01-08 ENCOUNTER — Inpatient Hospital Stay (HOSPITAL_COMMUNITY)
Admission: AD | Admit: 2016-01-08 | Discharge: 2016-01-08 | DRG: 782 | Disposition: A | Discharge status: Other Acute Inpatient Hospital | Payer: Medicaid Other | Source: Ambulatory Visit | Attending: Obstetrics and Gynecology | Admitting: Obstetrics and Gynecology

## 2016-01-08 ENCOUNTER — Encounter (HOSPITAL_COMMUNITY): Payer: Self-pay | Admitting: *Deleted

## 2016-01-08 DIAGNOSIS — O321XX0 Maternal care for breech presentation, not applicable or unspecified: Secondary | ICD-10-CM | POA: Diagnosis not present

## 2016-01-08 DIAGNOSIS — O42919 Preterm premature rupture of membranes, unspecified as to length of time between rupture and onset of labor, unspecified trimester: Secondary | ICD-10-CM | POA: Diagnosis present

## 2016-01-08 DIAGNOSIS — O42912 Preterm premature rupture of membranes, unspecified as to length of time between rupture and onset of labor, second trimester: Principal | ICD-10-CM | POA: Diagnosis present

## 2016-01-08 DIAGNOSIS — Z3A23 23 weeks gestation of pregnancy: Secondary | ICD-10-CM

## 2016-01-08 DIAGNOSIS — O4692 Antepartum hemorrhage, unspecified, second trimester: Secondary | ICD-10-CM | POA: Diagnosis present

## 2016-01-08 HISTORY — DX: Other specified health status: Z78.9

## 2016-01-08 LAB — URINALYSIS, ROUTINE W REFLEX MICROSCOPIC
BILIRUBIN URINE: NEGATIVE
Glucose, UA: 250 mg/dL — AB
KETONES UR: 15 mg/dL — AB
Nitrite: NEGATIVE
PH: 6 (ref 5.0–8.0)
PROTEIN: 30 mg/dL — AB
Specific Gravity, Urine: 1.025 (ref 1.005–1.030)

## 2016-01-08 LAB — URINE MICROSCOPIC-ADD ON

## 2016-01-08 LAB — POCT FERN TEST: POCT Fern Test: NEGATIVE

## 2016-01-08 LAB — AMNISURE RUPTURE OF MEMBRANE (ROM) NOT AT ARMC: AMNISURE: NEGATIVE

## 2016-01-08 MED ORDER — SODIUM CHLORIDE 0.9 % IV SOLN
2.0000 g | Freq: Once | INTRAVENOUS | Status: AC
  Administered 2016-01-08: 2 g via INTRAVENOUS
  Filled 2016-01-08: qty 2000

## 2016-01-08 MED ORDER — SODIUM CHLORIDE 0.9 % IV SOLN
250.0000 mg | Freq: Four times a day (QID) | INTRAVENOUS | Status: DC
  Filled 2016-01-08 (×2): qty 5

## 2016-01-08 MED ORDER — SODIUM CHLORIDE 0.9 % IV SOLN
250.0000 mg | Freq: Once | INTRAVENOUS | Status: AC
  Administered 2016-01-08: 250 mg via INTRAVENOUS
  Filled 2016-01-08: qty 5

## 2016-01-08 MED ORDER — MAGNESIUM SULFATE 50 % IJ SOLN
2.0000 g/h | INTRAVENOUS | Status: DC
  Administered 2016-01-08: 2 g/h via INTRAVENOUS
  Filled 2016-01-08: qty 80

## 2016-01-08 MED ORDER — BETAMETHASONE SOD PHOS & ACET 6 (3-3) MG/ML IJ SUSP: 12.0000 mg | Freq: Once | INTRAMUSCULAR | Status: DC

## 2016-01-08 MED ORDER — PRENATAL MULTIVITAMIN CH
1.0000 | ORAL_TABLET | Freq: Every day | ORAL | Status: DC
  Filled 2016-01-08 (×2): qty 1

## 2016-01-08 MED ORDER — DOCUSATE SODIUM 100 MG PO CAPS
100.0000 mg | ORAL_CAPSULE | Freq: Every day | ORAL | Status: DC
  Filled 2016-01-08 (×2): qty 1

## 2016-01-08 MED ORDER — AMOXICILLIN 500 MG PO CAPS: 500.0000 mg | ORAL_CAPSULE | Freq: Three times a day (TID) | ORAL | Status: DC

## 2016-01-08 MED ORDER — ERYTHROMYCIN BASE 250 MG PO TABS: 250.0000 mg | ORAL_TABLET | Freq: Four times a day (QID) | ORAL | Status: DC

## 2016-01-08 MED ORDER — SODIUM CHLORIDE 0.9 % IV SOLN
INTRAVENOUS | Status: DC
  Administered 2016-01-08: 12:00:00 via INTRAVENOUS

## 2016-01-08 MED ORDER — LACTATED RINGERS IV SOLN
Freq: Once | INTRAVENOUS | Status: AC
  Administered 2016-01-08: 11:00:00 via INTRAVENOUS

## 2016-01-08 MED ORDER — PENICILLIN G POTASSIUM 5000000 UNITS IJ SOLR: 2.5000 10*6.[IU] | INTRAVENOUS | Status: DC

## 2016-01-08 MED ORDER — BETAMETHASONE SOD PHOS & ACET 6 (3-3) MG/ML IJ SUSP
12.0000 mg | INTRAMUSCULAR | Status: DC
  Administered 2016-01-08: 12 mg via INTRAMUSCULAR
  Filled 2016-01-08 (×2): qty 2

## 2016-01-08 MED ORDER — MAGNESIUM SULFATE BOLUS VIA INFUSION
4.0000 g | Freq: Once | INTRAVENOUS | Status: AC
  Administered 2016-01-08: 4 g via INTRAVENOUS
  Filled 2016-01-08: qty 500

## 2016-01-08 MED ORDER — PENICILLIN G POTASSIUM 5000000 UNITS IJ SOLR: 5.0000 10*6.[IU] | Freq: Once | INTRAVENOUS | Status: DC

## 2016-01-08 NOTE — MAU Note (Signed)
Nichole with [place] on unit provider notified need of stat bedside ultrasound.

## 2016-01-08 NOTE — MAU Note (Signed)
Called [facility] 3 times with no answer, on 4th try spoke with Michele [employer] engineer) to transport patient to [facility] was told they would be sent right away.

## 2016-01-08 NOTE — Progress Notes (Signed)
Provider notified of pt arrival to MAU.  Notified of a G2P0 at [redacted]w[redacted]d with complaints of vaginal bleeding and abdominal cramping.  Provider is currently in the OR and will see pt when finished.

## 2016-01-08 NOTE — MAU Note (Signed)
Pt states the abdominal pain started two days ago.  Pt states the vaginal bleeding started in the middle of the night.  Pt states she sees the blood when she wipes and has not had to put on a pad.  Pt states she saw a quarter sized clot in the toilet this morning.

## 2016-01-08 NOTE — MAU Note (Signed)
House Coverage notified of pt in MAU.  Updated on pt status.

## 2016-01-08 NOTE — Progress Notes (Signed)
Patient seen and evaluated in MAU. She was found lying comfortably on the bed. She reports feeling warm ever since the administration of the magnesium sulfate. She denies any abdominal/pelvic pain, contractions or cramps. She reports good fetal movement.  Blood pressure 95/53, pulse 115, temperature 98.2 F (36.8 C), temperature source Oral, resp. rate 20, last menstrual period 07/30/2015, SpO2 99 %, unknown if currently breastfeeding.  GENERAL: Well-developed, well-nourished female in no acute distress.  ABDOMEN: Soft, gravid, nontender PELVIC: Normal external female genitalia. Vagina is pink and rugated. Copious amount of clear fluid. Sterile digital exam: cervix 3 cm with fetal foot palpated within the endocervical canal EXTREMITIES: No cyanosis, clubbing, or edema, 2+ distal pulses.  A/P 25 yo G2P0010 at 23 weeks 1 day with PPROM - Patient loaded on 4 gm magnesium sulfate for tocolysis - Betamethasone x 1 dose around 12:20pm - Latency antibiotics initiated secondary to PPROM - NICU census if full and NICU attending requested transfer if patient is stable - No cervical change since initial presentation 1 hour ago without evidence of contractions.  - Case discussed with Dr. Julian Reil at [place], who accepted the transfer - NICU consult was called to counsel patient on expectation of a premature delivery in the setting of PPROM at 23 weeks - [facility] has been contacted and is in route

## 2016-01-08 NOTE — MAU Note (Signed)
Report given to [facility] was told 10 minutes ETA

## 2016-01-08 NOTE — MAU Note (Signed)
Neonatologist at the bedside.  Discussing options with pt and family.

## 2016-01-08 NOTE — Progress Notes (Addendum)
Lelan Pons, NP notified of pt arrival to MAU.  Notified that pt is to be seen by Dr. Ashok Pall but Dr. Ashok Pall is currently in the OR just starting a case.  Provider updated on pt complaints and condition.  Provider states she will come see the pt.

## 2016-01-08 NOTE — MAU Provider Note (Signed)
History     CSN: [id number]  Arrival date and time: 01/08/16 1025   First Provider Initiated Contact with Patient 01/08/16 1102      Chief Complaint  Patient presents with  . Abdominal Pain  . Vaginal Bleeding   HPI Marilyn Patel 25 y.o. [redacted]w[redacted]d  Comes to MAU with vaginal bleeding when she wipes and saw a small clot in the toilet this morning.  Also complains of lower abdominal crampingfor 2 days which is worsening although no contractions are showing on the monitor strip and no contraction palpated.  Was seen in low risk clinic on 12-29-15.  Got medication for a yeast infection on 12-31-15 and has completed the vaginal treatment.     OB History    Gravida Para Term Preterm AB TAB SAB Ectopic Multiple Living        Past Medical History  Diagnosis Date  . Medical history non-contributory     Past Surgical History  Procedure Laterality Date  . Wisdom tooth extraction      History reviewed. No pertinent family history.  Social History  Substance Use Topics  . Smoking status: Never Smoker   . Smokeless tobacco: Never Used  . Alcohol Use: No     Comment: socially when not pregnant    Allergies: No Known Allergies  Prescriptions prior to admission  Medication Sig Dispense Refill Last Dose  . Prenatal Vit-Fe Fumarate-FA (MULTIVITAMIN-PRENATAL) 27-0.8 MG TABS tablet Take 1 tablet by mouth daily at 12 noon.   01/08/2016 at Unknown time  . amoxicillin (AMOXIL) 500 MG capsule Take 1 capsule (500 mg total) by mouth 3 (three) times daily. (Patient not taking: Reported on 12/29/2015) 21 capsule 0 Not Taking at Unknown time  . glycopyrrolate (ROBINUL) 1 MG tablet Take 1 tablet (1 mg total) by mouth 3 (three) times daily as needed. (Patient not taking: Reported on 12/01/2015) 30 tablet 1 Not Taking at Unknown time  . terconazole (TERAZOL 7) 0.4 % vaginal cream Place 1 applicator vaginally at bedtime. For 3 days. (Patient not taking: Reported on 01/08/2016) 45 g 0  Completed Course at Unknown time    Review of Systems  Gastrointestinal: Positive for abdominal pain.  Genitourinary:       No vaginal discharge. Vaginal bleeding noted when wiping. No dysuria.   Physical Exam   Blood pressure 123/53, pulse 129, temperature 99.2 F (37.3 C), temperature source Oral, resp. rate 16, last menstrual period 07/30/2015, unknown if currently breastfeeding.  Physical Exam  Nursing note and vitals reviewed. Constitutional: She is oriented to person, place, and time. She appears well-developed and well-nourished.  HENT:  Head: Normocephalic.  Eyes: EOM are normal.  Neck: Neck supple.  GI: Soft. There is no tenderness.  Genitourinary:  Speculum exam: Vagina - Gross pooling of clear fluid, swab taken for ferning Cervix - hard to visualize but seems open Bimanual exam with sterile glove Cervix open 3-4 cm and fetal parts felt within the cervix Chaperone present for exam.  Musculoskeletal: Normal range of motion.  Neurological: She is alert and oriented to person, place, and time.  Skin: Skin is warm and dry.  Psychiatric: She has a normal mood and affect.    MAU Course  Procedures  MDM During speculum exam, client reports she has had some leaking of fluid for a couple of days - not enough to wear a pad. Called Dr. Jolayne Panther to give report and ordered bedside ultrasound for  fetal position.  Assessment and Plan  Preterm labor with rupture of membranes Dr. Ashok Pall to come to see patient.  Marilyn Patel 01/08/2016, 11:17 AM

## 2016-01-08 NOTE — MAU Provider Note (Signed)
MAU HISTORY AND PHYSICAL  Chief Complaint:  Abdominal Pain and Vaginal Bleeding   Marilyn Patel is a 25 y.o.  G2P0010 with IUP at [redacted]w[redacted]d presenting for Abdominal Pain and Vaginal Bleeding  Patient reports that for the past 2 days has experience intermittent lower abdominal cramping and leakage of fluid. Recently diagnosed with yeast infection and assumed fluid was discharge. Today noticed blood when wiping. Pain is mild and infrequent.    Past Medical History  Diagnosis Date  . Medical history non-contributory     Past Surgical History  Procedure Laterality Date  . Wisdom tooth extraction      History reviewed. No pertinent family history.  Social History  Substance Use Topics  . Smoking status: Never Smoker   . Smokeless tobacco: Never Used  . Alcohol Use: No     Comment: socially when not pregnant    No Known Allergies  Prescriptions prior to admission  Medication Sig Dispense Refill Last Dose  . Prenatal Vit-Fe Fumarate-FA (MULTIVITAMIN-PRENATAL) 27-0.8 MG TABS tablet Take 1 tablet by mouth daily at 12 noon.   01/08/2016 at Unknown time  . amoxicillin (AMOXIL) 500 MG capsule Take 1 capsule (500 mg total) by mouth 3 (three) times daily. (Patient not taking: Reported on 12/29/2015) 21 capsule 0 Not Taking at Unknown time  . glycopyrrolate (ROBINUL) 1 MG tablet Take 1 tablet (1 mg total) by mouth 3 (three) times daily as needed. (Patient not taking: Reported on 12/01/2015) 30 tablet 1 Not Taking at Unknown time  . terconazole (TERAZOL 7) 0.4 % vaginal cream Place 1 applicator vaginally at bedtime. For 3 days. (Patient not taking: Reported on 01/08/2016) 45 g 0 Completed Course at Unknown time    Review of Systems - Negative except for what is mentioned in HPI.  Physical Exam  Blood pressure 95/53, pulse 115, temperature 98.2 F (36.8 C), temperature source Oral, resp. rate 20, last menstrual period 07/30/2015, SpO2 99 %, unknown if currently breastfeeding. GENERAL:  Well-developed, well-nourished female in no acute distress.  LUNGS: Clear to auscultation bilaterally.  HEART: Regular rate and rhythm. ABDOMEN: Soft, nontender, nondistended, gravid.  EXTREMITIES: Nontender, no edema, 2+ distal pulses. Cervical Exam: 3-4 cm dilated with fetal parts palpated (per other provider exam)  FHT: 150s   Labs: Results for orders placed or performed during the hospital encounter of 01/08/16 (from the past 24 hour(s))  Urinalysis, Routine w reflex microscopic (not at [facility])   Collection Time: 01/08/16 10:30 AM  Result Value Ref Range   Color, Urine YELLOW YELLOW   APPearance CLEAR CLEAR   Specific Gravity, Urine 1.025 1.005 - 1.030   pH 6.0 5.0 - 8.0   Glucose, UA 250 (A) NEGATIVE mg/dL   Hgb urine dipstick LARGE (A) NEGATIVE   Bilirubin Urine NEGATIVE NEGATIVE   Ketones, ur 15 (A) NEGATIVE mg/dL   Protein, ur 30 (A) NEGATIVE mg/dL   Nitrite NEGATIVE NEGATIVE   Leukocytes, UA SMALL (A) NEGATIVE  Urine microscopic-add on   Collection Time: 01/08/16 10:30 AM  Result Value Ref Range   Squamous Epithelial / LPF 0-5 (A) NONE SEEN   WBC, UA TOO NUMEROUS TO COUNT 0 - 5 WBC/hpf   RBC / HPF 6-30 0 - 5 RBC/hpf   Bacteria, UA FEW (A) NONE SEEN   Urine-Other MUCOUS PRESENT   Fern Test   Collection Time: 01/08/16 11:00 AM  Result Value Ref Range   POCT Fern Test Negative = intact amniotic membranes   Amnisure rupture of membrane (rom)not  at [facility]   Collection Time: 01/08/16 11:30 AM  Result Value Ref Range   Amnisure ROM NEGATIVE     Imaging Studies:  [place] Mfm Ob Comp + 14 Wk  12/10/2015  OBSTETRICAL ULTRASOUND: This exam was performed within a [place] Ul[facility]ound Department. The OB US report was generated in the [facility] system, and faxed to the ordering physician.  This report is available in the [employer]. See the [facility] Obstetric US report via the Image Link.   Assessment: Marilyn Patel is  25 y.o. G2P0010 at [redacted]w[redacted]d presents with preterm labor and breech  presentation. Appears to be ruptured given positive pooling and appreciation of foot extending from cervix without palpable membranes, though ferning and amnisure both noted to be negative and fluid level subjectivel wnl on bedside u/s.  Plan: - given high NICU census and patient undecided as to whether she desires attempts at neonatal resuscitation, will transfer patient to [facility] in [place], who has accepted care. - latency antibiotics started: amoxicillin and erythromycin - magnesium sulfate started for neuroprotection and tocolysis - betamethasone 12 mg IM administered  Silvano Bilis 2/18/201712:35 PM

## 2016-01-17 NOTE — Discharge Summary (Signed)
Obstetric Discharge Summary Reason for Admission: PPROM Prenatal Procedures: BMZ, latency antibiotcs, tocolysis and CP prophylaxis with magnesium sulfate Intrapartum Procedures: patient transferred to [facility] for further management due to a high NICU census Postpartum Procedures:  Complications-Operative and Postpartum:  HEMOGLOBIN  Date Value Ref Range Status  01/15/2016 11.6* 12.0 - 15.0 g/dL Final   HCT  Date Value Ref Range Status  01/15/2016 33.4* 36.0 - 46.0 % Final    Physical Exam:  General: alert, cooperative and no distress Uterine Fundus: soft, NT No cervical change since admission DVT Evaluation: No evidence of DVT seen on physical exam.  Discharge Diagnoses: PPROM  Discharge Information: Date: 01/17/2016 Activity: pelvic rest Diet: routine Condition: stable Discharge to: transferred to [facility] Follow-up Information    Follow up with [facility] In 2 days.   Why:  BP check   Contact information:   [address] [place] [address] [phone]      Follow up with [facility] In 6 weeks.   Why:  You will be called with appointment time and date for postpartum appointment. Call clinic/come to [facility] for any concerning issues   Contact information:   [address] [place] [address] [phone]       Marilyn Patel 01/17/2016, 10:30 AM

## 2016-01-18 ENCOUNTER — Encounter: Payer: Self-pay | Admitting: *Deleted

## 2016-01-25 ENCOUNTER — Encounter: Payer: Medicaid Other | Admitting: Family

## 2016-02-07 ENCOUNTER — Ambulatory Visit (INDEPENDENT_AMBULATORY_CARE_PROVIDER_SITE_OTHER): Payer: Medicaid Other | Admitting: Obstetrics & Gynecology

## 2016-02-07 ENCOUNTER — Encounter: Payer: Self-pay | Admitting: Obstetrics & Gynecology

## 2016-02-07 NOTE — Patient Instructions (Signed)

## 2016-02-07 NOTE — Progress Notes (Signed)
Patient ID: Marilyn Patel, female   DOB: December 02, 1990, 25 y.o.   MRN: [id number] Subjective: [facility]     Marilyn Patel is a 25 y.o. female who presents for a postpartum visit. She is 4 weeks postpartum following a Classical c/section. I have fully reviewed the prenatal and intrapartum course. The delivery was at 3719w2d gestational weeks. Outcome: primary cesarean section, classical incision. Anesthesia: epidural. Postpartum course has been complicated by neonatal death. Baby died at 6920 days old. . Bleeding thin lochia. Bowel function is normal. Bladder function is normal. Patient is not sexually active. Contraception method is OCP (estrogen/progesterone). Postpartum depression screening: positive. Given booklets for [facility] for perinatal loss, and [facility]. Also [facility] if she feels she needs counseling.   The following portions of the patient's history were reviewed and updated as appropriate: allergies, current medications, past family history, past medical history, past social history, past surgical history and problem list.  Review of Systems Behavioral/Psych: positive for sleep disturbance   Objective:    LMP 07/30/2015  General:  alert, cooperative and no distress           Abdomen: soft, non-tender; bowel sounds normal; no masses,  no organomegaly and incision well healed   Vulva:  not evaluated                       Assessment:     normal postpartum exam. Pap smear not done at today's visit.   Plan:    1. Contraception: OCP (estrogen/progesterone) 2. May start OCP, should abstain for 1 week 3. Follow up as needed.    Adam PhenixJames G Arnold, MD 02/07/2016

## 2016-04-03 ENCOUNTER — Ambulatory Visit: Payer: Medicaid Other | Admitting: Obstetrics & Gynecology

## 2016-04-03 ENCOUNTER — Encounter: Payer: Self-pay | Admitting: *Deleted

## 2016-04-03 NOTE — Progress Notes (Signed)
Ms. Marilyn Patel had an appointment for pap smear scheduled for today that she did not keep. Reviewed with provider ( Dr. Debroah LoopArnold) and patient may reshedule at any time if desired.

## 2016-06-30 ENCOUNTER — Encounter (HOSPITAL_COMMUNITY): Payer: Self-pay | Admitting: Obstetrics and Gynecology

## 2018-01-25 ENCOUNTER — Encounter: Payer: Self-pay | Admitting: *Deleted
